# Patient Record
Sex: Male | Born: 1990 | Race: Black or African American | Hispanic: No | Marital: Single | Smoking: Never smoker
Health system: Southern US, Community
[De-identification: ages and names within clinical notes are randomized; demographics above are authoritative.]

## PROBLEM LIST (undated history)

## (undated) HISTORY — PX: TONSILLECTOMY: SUR1361

---

## 2018-04-21 ENCOUNTER — Encounter (HOSPITAL_BASED_OUTPATIENT_CLINIC_OR_DEPARTMENT_OTHER): Payer: Self-pay

## 2018-04-21 ENCOUNTER — Emergency Department (HOSPITAL_BASED_OUTPATIENT_CLINIC_OR_DEPARTMENT_OTHER): Payer: BC Managed Care – PPO

## 2018-04-21 ENCOUNTER — Other Ambulatory Visit: Payer: Self-pay

## 2018-04-21 ENCOUNTER — Emergency Department (HOSPITAL_BASED_OUTPATIENT_CLINIC_OR_DEPARTMENT_OTHER)
Admission: EM | Admit: 2018-04-21 | Discharge: 2018-04-21 | Disposition: A | Discharge status: Home / Self Care | Payer: BC Managed Care – PPO | Attending: Emergency Medicine | Admitting: Emergency Medicine

## 2018-04-21 DIAGNOSIS — G510 Bell's palsy: Secondary | ICD-10-CM | POA: Insufficient documentation

## 2018-04-21 DIAGNOSIS — R51 Headache: Secondary | ICD-10-CM | POA: Insufficient documentation

## 2018-04-21 DIAGNOSIS — H9201 Otalgia, right ear: Secondary | ICD-10-CM | POA: Insufficient documentation

## 2018-04-21 DIAGNOSIS — R29810 Facial weakness: Secondary | ICD-10-CM | POA: Diagnosis present

## 2018-04-21 LAB — COMPREHENSIVE METABOLIC PANEL
ALT: 12 U/L — AB (ref 17–63)
ANION GAP: 11 (ref 5–15)
AST: 16 U/L (ref 15–41)
Albumin: 4.4 g/dL (ref 3.5–5.0)
Alkaline Phosphatase: 39 U/L (ref 38–126)
BUN: 11 mg/dL (ref 6–20)
CHLORIDE: 102 mmol/L (ref 101–111)
CO2: 24 mmol/L (ref 22–32)
Calcium: 9.2 mg/dL (ref 8.9–10.3)
Creatinine, Ser: 1.03 mg/dL (ref 0.61–1.24)
GFR calc non Af Amer: >60 mL/min (ref >60)
Glucose, Bld: 94 mg/dL (ref 65–99)
POTASSIUM: 3.5 mmol/L (ref 3.5–5.1)
SODIUM: 137 mmol/L (ref 135–145)
Total Bilirubin: 1.2 mg/dL (ref 0.3–1.2)
Total Protein: 7.6 g/dL (ref 6.5–8.1)

## 2018-04-21 LAB — URINALYSIS, ROUTINE W REFLEX MICROSCOPIC
Bilirubin Urine: NEGATIVE
Glucose, UA: NEGATIVE mg/dL
Hgb urine dipstick: NEGATIVE
Ketones, ur: NEGATIVE mg/dL
Leukocytes, UA: NEGATIVE
NITRITE: NEGATIVE
Protein, ur: NEGATIVE mg/dL
SPECIFIC GRAVITY, URINE: 1.025 (ref 1.005–1.030)
pH: 6 (ref 5.0–8.0)

## 2018-04-21 LAB — CBC WITH DIFFERENTIAL/PLATELET
BASOS PCT: 0 %
Basophils Absolute: 0 10*3/uL (ref 0.0–0.1)
Eosinophils Absolute: 0.1 10*3/uL (ref 0.0–0.7)
Eosinophils Relative: 1 %
HEMATOCRIT: 42.4 % (ref 39.0–52.0)
HEMOGLOBIN: 15.3 g/dL (ref 13.0–17.0)
Lymphocytes Relative: 27 %
Lymphs Abs: 2.6 10*3/uL (ref 0.7–4.0)
MCH: 30.6 pg (ref 26.0–34.0)
MCHC: 36.1 g/dL — ABNORMAL HIGH (ref 30.0–36.0)
MCV: 84.8 fL (ref 78.0–100.0)
Monocytes Absolute: 0.8 10*3/uL (ref 0.1–1.0)
Monocytes Relative: 8 %
NEUTROS ABS: 6.3 10*3/uL (ref 1.7–7.7)
Neutrophils Relative %: 64 %
PLATELETS: 259 10*3/uL (ref 150–400)
RBC: 5 MIL/uL (ref 4.22–5.81)
RDW: 13.8 % (ref 11.5–15.5)
WBC: 9.9 10*3/uL (ref 4.0–10.5)

## 2018-04-21 LAB — RAPID URINE DRUG SCREEN, HOSP PERFORMED
Amphetamines: NOT DETECTED
BENZODIAZEPINES: NOT DETECTED
Barbiturates: NOT DETECTED
Cocaine: NOT DETECTED
Opiates: NOT DETECTED
Tetrahydrocannabinol: NOT DETECTED

## 2018-04-21 LAB — PROTIME-INR
INR: 1.08
PROTHROMBIN TIME: 13.9 s (ref 11.4–15.2)

## 2018-04-21 LAB — ETHANOL: Alcohol, Ethyl (B): <10 mg/dL (ref <10)

## 2018-04-21 MED ORDER — PREDNISONE 20 MG PO TABS: 60.0000 mg | ORAL_TABLET | Freq: Every day | ORAL | 0 refills | Status: AC

## 2018-04-21 MED ORDER — VALACYCLOVIR HCL 1 G PO TABS: 1000.0000 mg | ORAL_TABLET | Freq: Three times a day (TID) | ORAL | 0 refills | Status: AC

## 2018-04-21 NOTE — ED Notes (Signed)
Note for work given. Family at bedside

## 2018-04-21 NOTE — ED Notes (Signed)
Pt reports recent travel to [place]. C/o right ear pain for several days and was started on antibiotic and prednisone. Today woke with right facial droop. States he thinks he "might" have had Bells palsy in the past.

## 2018-04-21 NOTE — ED Provider Notes (Signed)
[facility] EMERGENCY DEPARTMENT Provider Note   CSN: [id number] Arrival date & time: 04/21/18  1412     History   Chief Complaint Chief Complaint  Patient presents with  . Facial Droop    HPI Peter Tucker is a 27 y.o. 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  He reports that last night he started feeling like the right side of his face was heavy and when he woke up this morning at around 8 AM he felt like he could not move the right side of his face.  He reports that he attempted to go to work and that it has been getting gradually worse since.  Patient was recently started on amoxicillin and prednisone yesterday.  He reports taking "4 small pills of prednisone yesterday."  He denies any visual changes, no fevers or chills.  He chest pain or shortness of breath, no other symptoms.  He denies any trauma.    He reports pain on the right side of his face around his ear.  HPI  History reviewed. No pertinent past medical history.  There are no active problems to display for this patient.   Past Surgical History:  Procedure Laterality Date  . TONSILLECTOMY          Home Medications    Prior to Admission medications   Medication Sig Start Date End Date Taking? Authorizing Provider  predniSONE (DELTASONE) 20 MG tablet Take 3 tablets (60 mg total) by mouth daily for 7 days. 04/21/18 04/28/18  Cristina Gong, PA-C  valACYclovir (VALTREX) 1000 MG tablet Take 1 tablet (1,000 mg total) by mouth 3 (three) times daily for 7 days. 04/21/18 04/28/18  Cristina Gong, PA-C    Family History History reviewed. No pertinent family history.  Social History Social History   Tobacco Use  . Smoking status: Never Smoker  . Smokeless tobacco: Never Used  Substance Use Topics  . Alcohol use: Yes    Comment: once a week  . Drug use: Never     Allergies   Patient has no known allergies.   Review of  Systems Review of Systems  Constitutional: Negative for chills and fever.  HENT: Positive for ear pain. Negative for sore throat.   Eyes: Negative for pain and visual disturbance.  Respiratory: Negative for cough and shortness of breath.   Cardiovascular: Negative for chest pain and palpitations.  Gastrointestinal: Negative for abdominal pain and vomiting.  Genitourinary: Negative for dysuria and hematuria.  Musculoskeletal: Negative for arthralgias and back pain.  Skin: Negative for color change and rash.  Neurological: Positive for facial asymmetry and weakness. Negative for seizures and syncope.  All other systems reviewed and are negative.    Physical Exam Updated Vital Signs BP 140/76   Pulse 61   Temp 98.4 F (36.9 C) (Oral)   Resp 17   Ht  (1.727 m)   Wt 79.8 kg (176 lb)   SpO2 99%   BMI 26.76 kg/m   Physical Exam  Constitutional: He is oriented to person, place, and time. He appears well-developed and well-nourished.  HENT:  Head: Normocephalic and atraumatic.  Right Ear: Tympanic membrane and ear canal normal. There is mastoid tenderness. Tympanic membrane is not erythematous and not bulging.  Left Ear: Tympanic membrane, external ear and ear canal normal.  Nose: Nose normal.  Mouth/Throat: Uvula is midline and oropharynx is clear and moist.  Eyes: Conjunctivae are normal.  Neck: Neck supple.  Cardiovascular: Normal rate and regular rhythm.  No murmur heard. Pulmonary/Chest: Effort normal and breath sounds normal. No respiratory distress.  Abdominal: Soft. There is no tenderness.  Musculoskeletal: He exhibits no edema.  Neurological: He is alert and oriented to person, place, and time.  Mental Status:  Alert, oriented, thought content appropriate, able to give a coherent history. Speech fluent without evidence of aphasia. Able to follow 2 step commands without difficulty.  Cranial Nerves:  II:  Peripheral visual fields grossly normal, pupils equal, round,  reactive to light III,IV, VI: ptosis not present, extra-ocular motions intact bilaterally  V,VII: Smile is drooping on the right side, facial light touch sensation equal.  VIII: hearing grossly normal to voice  X: uvula elevates symmetrically  XI: bilateral shoulder shrug symmetric and strong XII: midline tongue extension without fassiculations Motor:  Normal tone. 5/5 in upper and lower extremities bilaterally including strong and equal grip strength and dorsiflexion/plantar flexion.  Cerebellar: normal finger-to-nose with bilateral upper extremities Gait: normal gait and balance CV: distal pulses palpable throughout    Skin: Skin is warm and dry. He is not diaphoretic.  Psychiatric: He has a normal mood and affect.  Nursing note and vitals reviewed.    ED Treatments / Results  Labs (all labs ordered are listed, but only abnormal results are displayed) Labs Reviewed  COMPREHENSIVE METABOLIC PANEL - Abnormal; Notable for the following components:      Result Value   ALT 12 (*)    All other components within normal limits  CBC WITH DIFFERENTIAL/PLATELET - Abnormal; Notable for the following components:   MCHC 36.1 (*)    All other components within normal limits  ETHANOL  PROTIME-INR  RAPID URINE DRUG SCREEN, HOSP PERFORMED  URINALYSIS, ROUTINE W REFLEX MICROSCOPIC    EKG None  Radiology Mr Brain Wo Contrast  Result Date: 04/21/2018 CLINICAL DATA:  Initial evaluation for acute sharp pain behind right ear for 2 days, right facial weakness. EXAM: MRI HEAD WITHOUT CONTRAST TECHNIQUE: Multiplanar, multiecho pulse sequences of the brain and surrounding structures were obtained without intravenous contrast. COMPARISON:  None available. FINDINGS: Brain: Cerebral volume within normal limits. No focal parenchymal signal abnormality identified. No abnormal foci of restricted diffusion to suggest acute or subacute ischemia. Gray-white matter differentiation maintained. No  encephalomalacia to suggest chronic infarction. No evidence for acute or chronic intracranial hemorrhage. No mass lesion, midline shift or mass effect. No hydrocephalus. No extra-axial fluid collection. Major dural sinuses are grossly patent. Pituitary gland suprasellar region normal. Midline structures intact and normal. Vascular: Major intracranial vascular flow voids are well maintained and normal in appearance. Skull and upper cervical spine: Craniocervical junction within normal limits. Upper cervical spine normal. Bone marrow signal intensity within normal limits. No scalp soft tissue abnormality. Sinuses/Orbits: Globes and orbital soft tissues within normal limits. Mild scattered mucosal thickening within the ethmoidal air cells. Paranasal sinuses are otherwise clear. No mastoid effusion. Inner ear structures normal. Other: None. IMPRESSION: Normal brain MRI.  No acute intracranial abnormality identified. Electronically Signed   By: Rise Mu M.D.   On: 04/21/2018 18:25    Procedures Procedures (including critical care time)  Medications Ordered in ED Medications - No data to display   Initial Impression / Assessment and Plan / ED Course  I have reviewed the triage vital signs and the nursing notes.  Pertinent labs & imaging results that were available during my care of the patient were reviewed by me and considered in my  medical decision making (see chart for details).    Peter Tucker is a 27 year old male who presents today for evaluation of facial droop.  He has pain on the right side of his face that started last night and when he woke up this morning he had left-sided facial droop.  No fevers or headache.  Was being treated with prednisone and antibiotics for a presumed right otitis media.  On physical exam his right TM is normal, he does not have any evidence of ear infection.  He is not able to raise his eyebrows.  Aside from facial droop his neuro exam is intact.  MRI was  obtained, due to abnormal presentation with pain.  MRI did not show any acute abnormalities in the brain, with normal inner ear structures.  With normal MRI, physical exam appears consistent with Bell's palsy.  Unsure why patient is having pain in the area.  Patient was instructed to discontinue the antibiotics as he does not have any evidence of otitis media or otitis externa.  He was started on prednisone, and Valtrex based on the severity of his facial palsy.  Patient was given strict return precautions, states his understanding.  He was instructed to use lubricating eyedrops.  PCP follow-up recommended.    This patient was seen as a shared visit with Dr. Anitra Lauth who evaluated the patient and agreed with my plan.    Final Clinical Impressions(s) / ED Diagnoses   Final diagnoses:  Facial weakness  Bell's palsy    ED Discharge Orders        Ordered    predniSONE (DELTASONE) 20 MG tablet  Daily     04/21/18 1845    valACYclovir (VALTREX) 1000 MG tablet  3 times daily     04/21/18 1845       Norman Clay 04/23/18 0116    Gwyneth Sprout, MD 04/25/18 1439

## 2018-04-21 NOTE — Discharge Instructions (Signed)
Please get eyedrops to moisten your eyes if you are unable to fully close your eyelid.  I recommend Refresh Celluvisc or another similar eye drop.    Please stop taking the antibiotics.  You do not appear to have an ear infection.  I have given you two prescriptions.  Please stop the steroids you were previously given.  I have given you a new prescription for a higher dose of steroids and another medication to help.    Please follow up with your primary care doctor.    If you do not have insurance please look up [employer] as they provide coupons for prescription medications.  I have given you a prescription for steroids today.  Some common side effects include feelings of extra energy, feeling warm, increased appetite, and stomach upset.  If you are diabetic your sugars may run higher than usual.

## 2018-04-21 NOTE — ED Notes (Signed)
Pt on auto VS  

## 2018-04-21 NOTE — ED Notes (Signed)
Peter Tucker in lab notified of additional orders

## 2018-04-21 NOTE — ED Notes (Signed)
EDPA notified of patients presentation.

## 2018-04-21 NOTE — ED Notes (Signed)
Patient transported to MRI 

## 2018-04-21 NOTE — ED Notes (Signed)
ED Provider at bedside. 

## 2018-04-21 NOTE — ED Triage Notes (Addendum)
Pt reports right facial droop/numbness that he noticed when he woke up around 8am today, right ear pain, pain behind right ear and along right side of head that began Wednesday. Grips equal bilaterally, pt ambulatory with steady gait. Taking prednisone and Amoxicillin since Friday for lymphadenopathy. Hx of Bell's Palsy during childhood.

## 2019-03-16 IMAGING — MR MR HEAD W/O CM
7 of 8 series · 40 of 48 positions shown · non-contrast
Comparison: None available.

CLINICAL DATA: Initial evaluation for acute sharp pain behind right
ear for 2 days, right facial weakness.

EXAM:
MRI HEAD WITHOUT CONTRAST
TECHNIQUE: Multiplanar, multiecho pulse sequences of the brain and surrounding
structures were obtained without intravenous contrast.

[Series 3: T1 · sagittal · 5.0mm · 0.90mm/px · 3 of 27 slices shown]
[im 1/27]
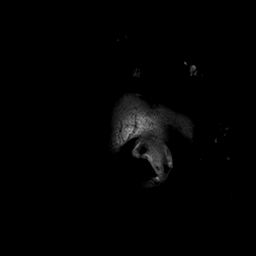
[im 14/27]
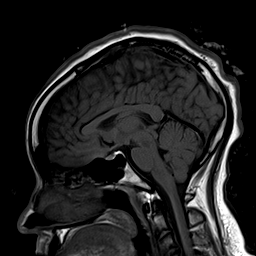
[im 27/27]
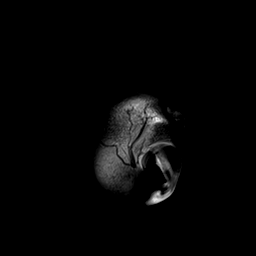

[Series 4: DWI · axial · 3.0mm · 1.88mm/px · z∈[-43,+109]mm · 13 of 94 slices shown (1 of 2)]
[im 1/94]
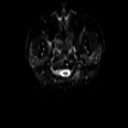
[im 8/94]
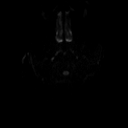
[im 16/94]
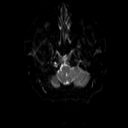
[im 24/94]
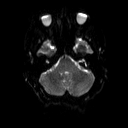
[im 32/94]
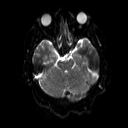
[im 39/94]
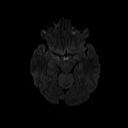
[im 47/94]
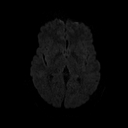
[im 55/94]
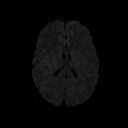
[im 63/94]
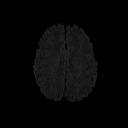
[im 70/94]
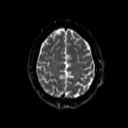
[im 78/94]
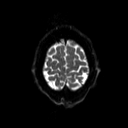
[im 86/94]
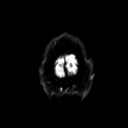
[im 94/94]
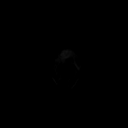

[Series 5: DWI · axial · 3.0mm · 1.88mm/px · z∈[-43,+109]mm · 6 of 47 slices shown (2 of 2)]
[im 1/47]
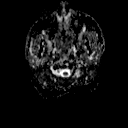
[im 10/47]
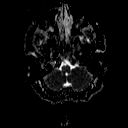
[im 19/47]
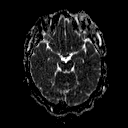
[im 28/47]
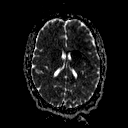
[im 37/47]
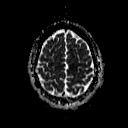
[im 47/47]
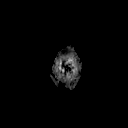

[Series 6: T2 · axial · 5.0mm · 0.69mm/px · z∈[-45,+114]mm · 4 of 28 slices shown (1 of 3)]
[im 1/28]
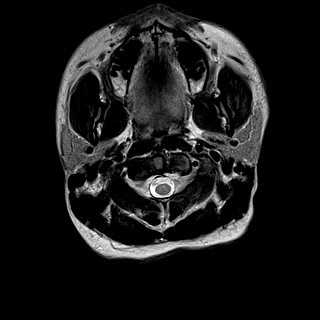
[im 10/28]
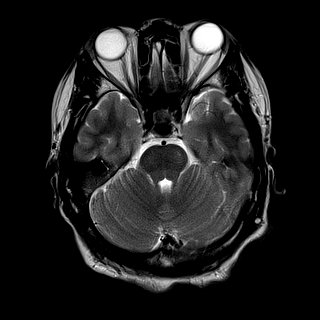
[im 19/28]
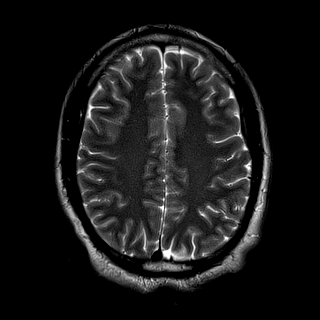
[im 28/28]
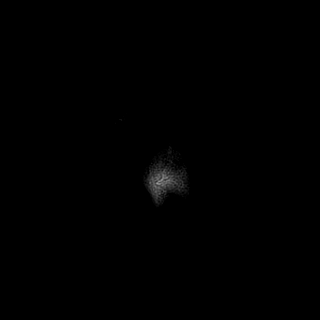

[Series 8: T2 · axial · 5.0mm · 0.43mm/px · z∈[-45,+114]mm · 4 of 28 slices shown (2 of 3)]
[im 1/28]
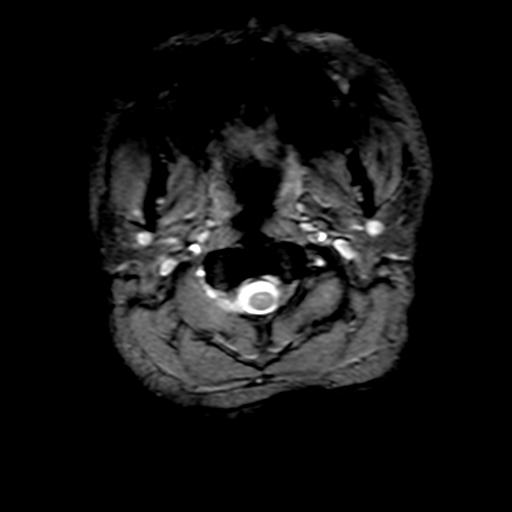
[im 10/28]
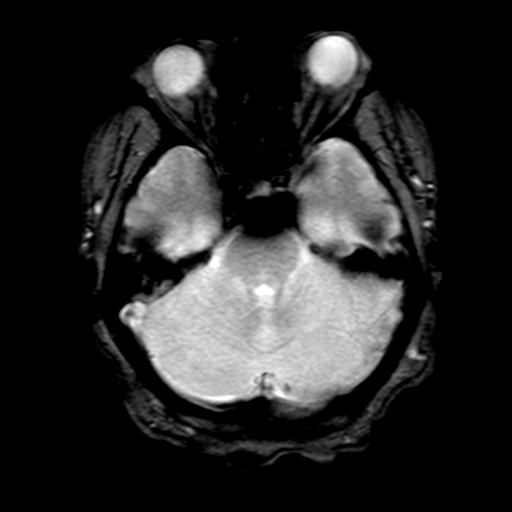
[im 19/28]
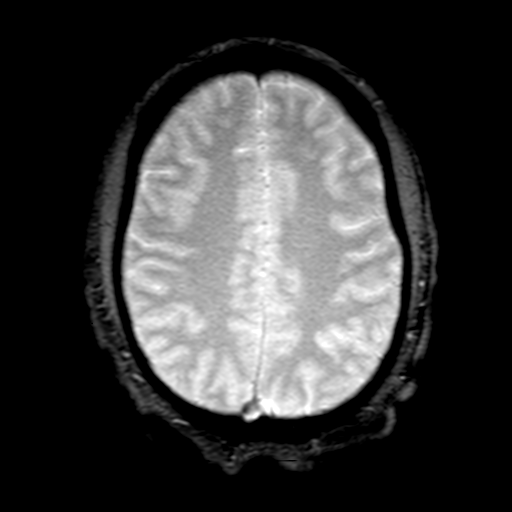
[im 28/28]
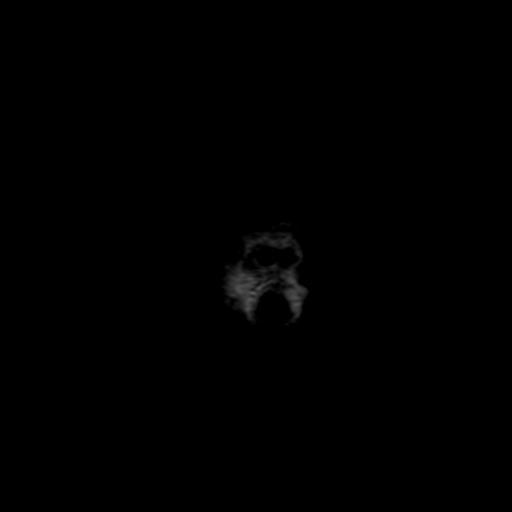

[Series 9: FLAIR · axial · 3.0mm · 0.43mm/px · z∈[-46,+115]mm · 6 of 42 slices shown]
[im 1/42]
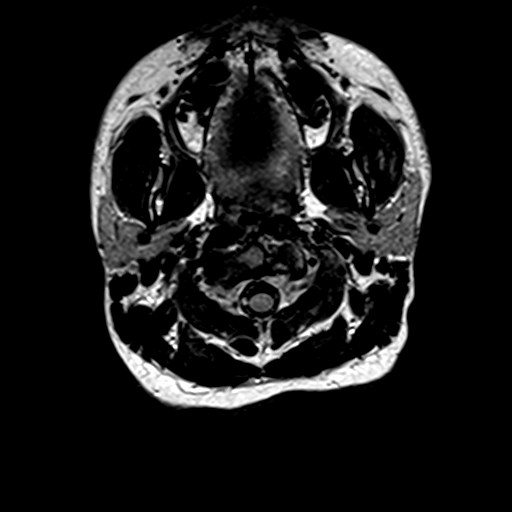
[im 9/42]
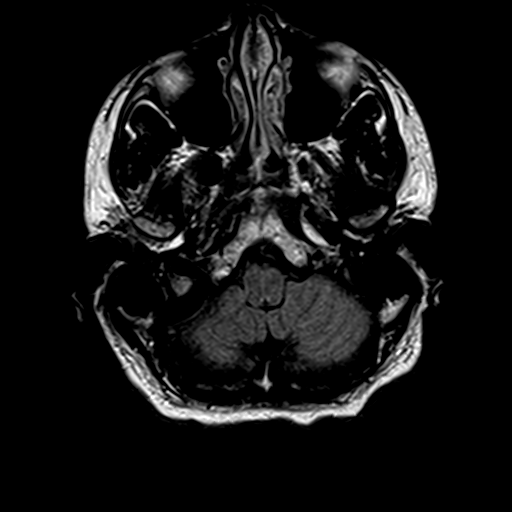
[im 17/42]
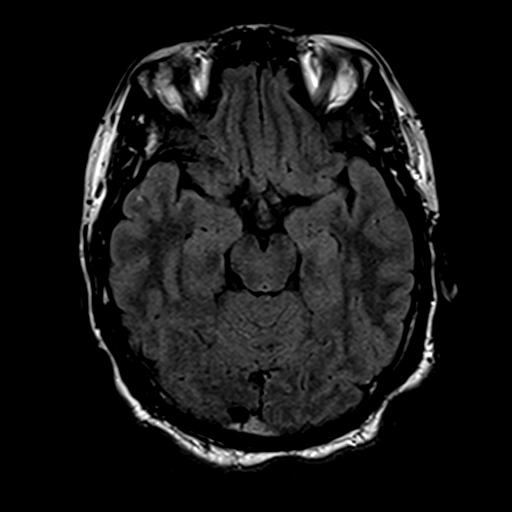
[im 25/42]
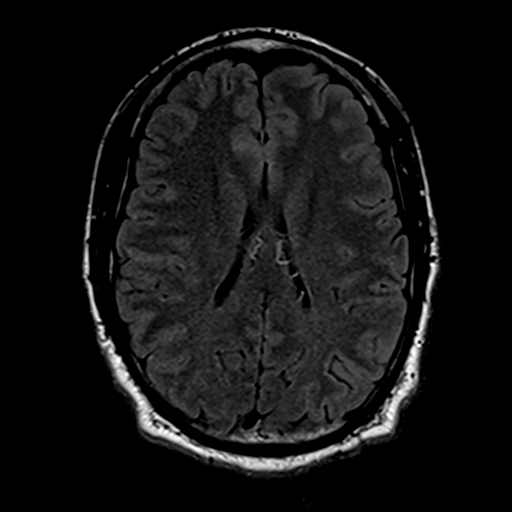
[im 33/42]
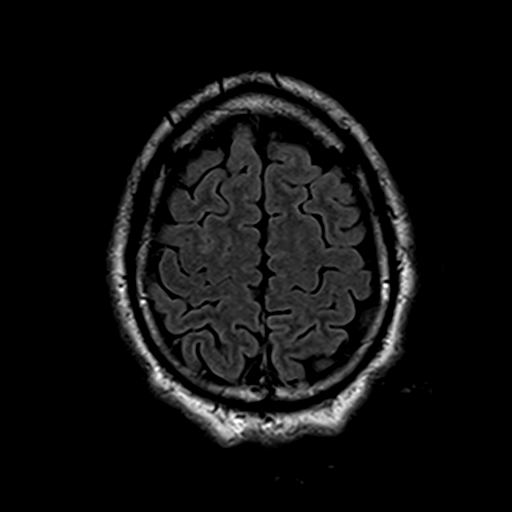
[im 42/42]
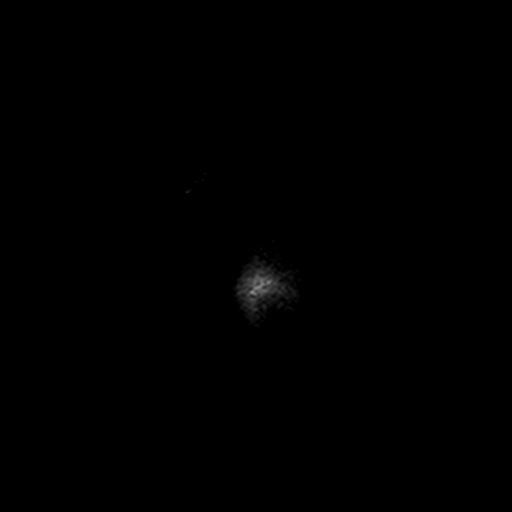

[Series 11: T2 · coronal · 5.0mm · 0.69mm/px · 4 of 30 slices shown (3 of 3)]
[im 1/30]
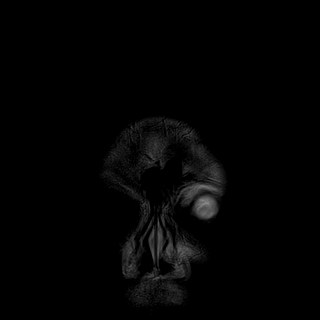
[im 10/30]
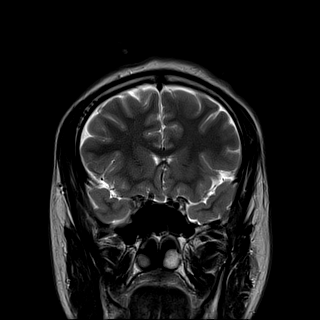
[im 20/30]
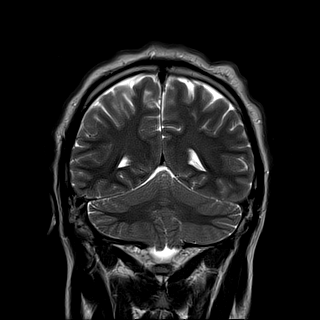
[im 30/30]
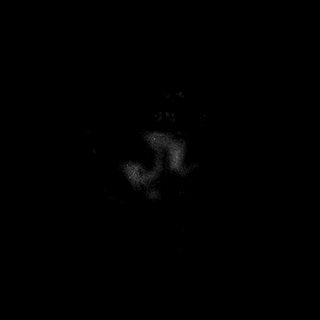

[40 of 48 positions shown; findings below may reference images not displayed]

FINDINGS: Brain: Cerebral volume within normal limits. No focal parenchymal
signal abnormality identified. No abnormal foci of restricted
diffusion to suggest acute or subacute ischemia. Gray-white matter
differentiation maintained. No encephalomalacia to suggest chronic
infarction. No evidence for acute or chronic intracranial
hemorrhage.

No mass lesion, midline shift or mass effect. No hydrocephalus. No
extra-axial fluid collection. Major dural sinuses are grossly
patent.

Pituitary gland suprasellar region normal. Midline structures intact
and normal.

Vascular: Major intracranial vascular flow voids are well maintained
and normal in appearance.

Skull and upper cervical spine: Craniocervical junction within
normal limits. Upper cervical spine normal. Bone marrow signal
intensity within normal limits. No scalp soft tissue abnormality.

Sinuses/Orbits: Globes and orbital soft tissues within normal
limits. Mild scattered mucosal thickening within the ethmoidal air
cells. Paranasal sinuses are otherwise clear. No mastoid effusion.
Inner ear structures normal.

Other: None.
IMPRESSION: Normal brain MRI.  No acute intracranial abnormality identified.
